# Patient Record
Sex: Male | Born: 2005
Health system: Southern US, Community
[De-identification: ages and names within clinical notes are randomized; demographics above are authoritative.]

## PROBLEM LIST (undated history)

## (undated) DIAGNOSIS — J45909 Unspecified asthma, uncomplicated: Secondary | ICD-10-CM

## (undated) DIAGNOSIS — R569 Unspecified convulsions: Secondary | ICD-10-CM

## (undated) DIAGNOSIS — F909 Attention-deficit hyperactivity disorder, unspecified type: Secondary | ICD-10-CM

## (undated) HISTORY — DX: Unspecified asthma, uncomplicated: J45.909

## (undated) HISTORY — DX: Attention-deficit hyperactivity disorder, unspecified type: F90.9

## (undated) HISTORY — DX: Unspecified convulsions: R56.9

---
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
## 2023-02-14 ENCOUNTER — Ambulatory Visit: Payer: 59 | Admitting: Dermatology

## 2023-02-14 VITALS — BP 106/69 | HR 60

## 2023-02-14 DIAGNOSIS — L814 Other melanin hyperpigmentation: Secondary | ICD-10-CM | POA: Diagnosis not present

## 2023-02-14 DIAGNOSIS — D2239 Melanocytic nevi of other parts of face: Secondary | ICD-10-CM

## 2023-02-14 DIAGNOSIS — D492 Neoplasm of unspecified behavior of bone, soft tissue, and skin: Secondary | ICD-10-CM

## 2023-02-14 NOTE — Progress Notes (Signed)
   New Patient Visit   Subjective  Mark Hardy is a 17 y.o. male who presents for the following: The patient has mole on his forehead to be evaluated, has grown and gets irritated, patient would like it removed today.   The following portions of the chart were reviewed this encounter and updated as appropriate: medications, allergies, medical history  Review of Systems:  No other skin or systemic complaints except as noted in HPI or Assessment and Plan.  Objective  Well appearing patient in no apparent distress; mood and affect are within normal limits.  A focused examination was performed of the following areas:face, scalp    Relevant exam findings are noted in the Assessment and Plan.  forehead at frontal hairline 6.0 mm flesh domed papule          Assessment & Plan    Neoplasm of skin forehead at frontal hairline  Epidermal / dermal shaving  Lesion diameter (cm):  0.6 Informed consent: discussed and consent obtained   Procedure prep:  Patient was prepped and draped in usual sterile fashion Prep type:  Isopropyl alcohol Anesthesia: the lesion was anesthetized in a standard fashion   Anesthetic:  1% lidocaine w/ epinephrine 1-100,000 buffered w/ 8.4% NaHCO3 Instrument used: flexible razor blade   Hemostasis achieved with: pressure, aluminum chloride and electrodesiccation   Outcome: patient tolerated procedure well   Post-procedure details: sterile dressing applied and wound care instructions given   Dressing type: bandage and petrolatum    Specimen 1 - Surgical pathology Differential Diagnosis: R/O Irritated Nevus vs other   Check Margins: No  Discussed resulting small scar with shave removal, and possible recurrence of lesion.  Recommend vaseline ointment to area daily and cover until healed.  Recommend photoprotection/sunscreen to area to prevent discoloration of scar.  Once healed, may apply OTC Serica scar gel bid to thickened scars.    LENTIGINES Exam: scattered tan macules Due to sun exposure Treatment Plan: Benign-appearing, observe. Recommend daily broad spectrum sunscreen SPF 30+ to sun-exposed areas, reapply every 2 hours as needed.  Call for any changes   Return if symptoms worsen or fail to improve.  I, Angelique Holm, CMA, am acting as scribe for Willeen Niece, MD .   Documentation: I have reviewed the above documentation for accuracy and completeness, and I agree with the above.  Willeen Niece, MD

## 2023-02-14 NOTE — Patient Instructions (Addendum)
Wound Care Instructions  Cleanse wound gently with soap and water once a day then pat dry with clean gauze. Apply a thin coat of Petrolatum (petroleum jelly, "Vaseline") over the wound (unless you have an allergy to this). We recommend that you use a new, sterile tube of Vaseline. Do not pick or remove scabs. Do not remove the yellow or white "healing tissue" from the base of the wound.  Cover the wound with fresh, clean, nonstick gauze and secure with paper tape. You may use Band-Aids in place of gauze and tape if the wound is small enough, but would recommend trimming much of the tape off as there is often too much. Sometimes Band-Aids can irritate the skin.  You should call the office for your biopsy report after 1 week if you have not already been contacted.  If you experience any problems, such as abnormal amounts of bleeding, swelling, significant bruising, significant pain, or evidence of infection, please call the office immediately.  FOR ADULT SURGERY PATIENTS: If you need something for pain relief you may take 1 extra strength Tylenol (acetaminophen) AND 2 Ibuprofen (200mg each) together every 4 hours as needed for pain. (do not take these if you are allergic to them or if you have a reason you should not take them.) Typically, you may only need pain medication for 1 to 3 days.     Due to recent changes in healthcare laws, you may see results of your pathology and/or laboratory studies on MyChart before the doctors have had a chance to review them. We understand that in some cases there may be results that are confusing or concerning to you. Please understand that not all results are received at the same time and often the doctors may need to interpret multiple results in order to provide you with the best plan of care or course of treatment. Therefore, we ask that you please give us 2 business days to thoroughly review all your results before contacting the office for clarification. Should  we see a critical lab result, you will be contacted sooner.   If You Need Anything After Your Visit  If you have any questions or concerns for your doctor, please call our main line at [phone] and press option 4 to reach your doctor's medical assistant. If no one answers, please leave a voicemail as directed and we will return your call as soon as possible. Messages left after 4 pm will be answered the following business day.   You may also send us a message via MyChart. We typically respond to MyChart messages within 1-2 business days.  For prescription refills, please ask your pharmacy to contact our office. Our fax number is [phone].  If you have an urgent issue when the clinic is closed that cannot wait until the next business day, you can page your doctor at the number below.    Please note that while we do our best to be available for urgent issues outside of office hours, we are not available 24/7.   If you have an urgent issue and are unable to reach us, you may choose to seek medical care at your doctor's office, retail clinic, urgent care center, or emergency room.  If you have a medical emergency, please immediately call 911 or go to the emergency department.  Pager Numbers  - Dr. Kowalski: [phone]  - Dr. Moye: [phone]  - Dr. Stewart: [phone]  In the event of inclement weather, please call our main line at   [phone] for an update on the status of any delays or closures.  Dermatology Medication Tips: Please keep the boxes that topical medications come in in order to help keep track of the instructions about where and how to use these. Pharmacies typically print the medication instructions only on the boxes and not directly on the medication tubes.   If your medication is too expensive, please contact our office at [phone] option 4 or send us a message through MyChart.   We are unable to tell what your co-pay for medications will be in  advance as this is different depending on your insurance coverage. However, we may be able to find a substitute medication at lower cost or fill out paperwork to get insurance to cover a needed medication.   If a prior authorization is required to get your medication covered by your insurance company, please allow us 1-2 business days to complete this process.  Drug prices often vary depending on where the prescription is filled and some pharmacies may offer cheaper prices.  The website www.goodrx.com contains coupons for medications through different pharmacies. The prices here do not account for what the cost may be with help from insurance (it may be cheaper with your insurance), but the website can give you the price if you did not use any insurance.  - You can print the associated coupon and take it with your prescription to the pharmacy.  - You may also stop by our office during regular business hours and pick up a GoodRx coupon card.  - If you need your prescription sent electronically to a different pharmacy, notify our office through [facility] MyChart or by phone at [phone] option 4.     Si Usted Necesita Algo Despus de Su Visita  Tambin puede enviarnos un mensaje a travs de MyChart. Por lo general respondemos a los mensajes de MyChart en el transcurso de 1 a 2 das hbiles.  Para renovar recetas, por favor pida a su farmacia que se ponga en contacto con nuestra oficina. Nuestro nmero de fax es el [phone].  Si tiene un asunto urgente cuando la clnica est cerrada y que no puede esperar hasta el siguiente da hbil, puede llamar/localizar a su doctor(a) al nmero que aparece a continuacin.   Por favor, tenga en cuenta que aunque hacemos todo lo posible para estar disponibles para asuntos urgentes fuera del horario de oficina, no estamos disponibles las 24 horas del da, los 7 das de la semana.   Si tiene un problema urgente y no puede comunicarse con nosotros, puede  optar por buscar atencin mdica  en el consultorio de su doctor(a), en una clnica privada, en un centro de atencin urgente o en una sala de emergencias.  Si tiene una emergencia mdica, por favor llame inmediatamente al 911 o vaya a la sala de emergencias.  Nmeros de bper  - Dr. Kowalski: [phone]  - Dra. Moye: [phone]  - Dra. Stewart: [phone]  En caso de inclemencias del tiempo, por favor llame a nuestra lnea principal al [phone] para una actualizacin sobre el estado de cualquier retraso o cierre.  Consejos para la medicacin en dermatologa: Por favor, guarde las cajas en las que vienen los medicamentos de uso tpico para ayudarle a seguir las instrucciones sobre dnde y cmo usarlos. Las farmacias generalmente imprimen las instrucciones del medicamento slo en las cajas y no directamente en los tubos del medicamento.   Si su medicamento es muy caro, por favor, pngase en contacto con   nuestra oficina llamando al [phone] y presione la opcin 4 o envenos un mensaje a travs de MyChart.   No podemos decirle cul ser su copago por los medicamentos por adelantado ya que esto es diferente dependiendo de la cobertura de su seguro. Sin embargo, es posible que podamos encontrar un medicamento sustituto a menor costo o llenar un formulario para que el seguro cubra el medicamento que se considera necesario.   Si se requiere una autorizacin previa para que su compaa de seguros cubra su medicamento, por favor permtanos de 1 a 2 das hbiles para completar este proceso.  Los precios de los medicamentos varan con frecuencia dependiendo del lugar de dnde se surte la receta y alguna farmacias pueden ofrecer precios ms baratos.  El sitio web www.goodrx.com tiene cupones para medicamentos de diferentes farmacias. Los precios aqu no tienen en cuenta lo que podra costar con la ayuda del seguro (puede ser ms barato con su seguro), pero el sitio web puede darle el  precio si no utiliz ningn seguro.  - Puede imprimir el cupn correspondiente y llevarlo con su receta a la farmacia.  - Tambin puede pasar por nuestra oficina durante el horario de atencin regular y recoger una tarjeta de cupones de GoodRx.  - Si necesita que su receta se enve electrnicamente a una farmacia diferente, informe a nuestra oficina a travs de MyChart de [facility] o por telfono llamando al [phone] y presione la opcin 4.  

## 2023-02-21 ENCOUNTER — Telehealth: Payer: Self-pay

## 2023-02-21 NOTE — Telephone Encounter (Signed)
-----   Message from Tara Stewart, MD sent at 02/21/2023  5:42 PM EDT ----- Skin , forehead at frontal hairline MELANOCYTIC NEVUS, INTRADERMAL TYPE, BASE INVOLVED  Benign mole, may recur - please call patient 

## 2023-02-21 NOTE — Telephone Encounter (Signed)
Left pt msg to call for bx result/sh °

## 2023-02-23 ENCOUNTER — Telehealth: Payer: Self-pay

## 2023-02-23 NOTE — Telephone Encounter (Signed)
-----   Message from Willeen Niece, MD sent at 02/21/2023  5:42 PM EDT ----- Skin , forehead at frontal hairline MELANOCYTIC NEVUS, INTRADERMAL TYPE, BASE INVOLVED  Benign mole, may recur - please call patient

## 2023-02-23 NOTE — Telephone Encounter (Signed)
Left message on cell phone, as authorized in DRP, biopsy of the forehead at frontal hairline was bening, may recur. Patient to call with any questions or concerns.

## 2023-06-22 ENCOUNTER — Other Ambulatory Visit: Payer: Self-pay

## 2023-07-20 DIAGNOSIS — Z1331 Encounter for screening for depression: Secondary | ICD-10-CM | POA: Diagnosis not present

## 2023-07-20 DIAGNOSIS — J4520 Mild intermittent asthma, uncomplicated: Secondary | ICD-10-CM | POA: Diagnosis not present

## 2023-07-20 DIAGNOSIS — F909 Attention-deficit hyperactivity disorder, unspecified type: Secondary | ICD-10-CM | POA: Diagnosis not present

## 2023-07-20 DIAGNOSIS — Z0000 Encounter for general adult medical examination without abnormal findings: Secondary | ICD-10-CM | POA: Diagnosis not present

## 2023-07-20 DIAGNOSIS — R569 Unspecified convulsions: Secondary | ICD-10-CM | POA: Diagnosis not present

## 2023-07-20 DIAGNOSIS — Z23 Encounter for immunization: Secondary | ICD-10-CM | POA: Diagnosis not present

## 2023-08-21 ENCOUNTER — Other Ambulatory Visit: Payer: Self-pay

## 2023-08-21 ENCOUNTER — Encounter: Payer: Self-pay | Admitting: Family Medicine

## 2023-08-21 ENCOUNTER — Ambulatory Visit (INDEPENDENT_AMBULATORY_CARE_PROVIDER_SITE_OTHER): Payer: 59 | Admitting: Family Medicine

## 2023-08-21 VITALS — BP 111/73 | HR 89 | Temp 98.2°F | Ht 69.75 in | Wt 186.0 lb

## 2023-08-21 DIAGNOSIS — F902 Attention-deficit hyperactivity disorder, combined type: Secondary | ICD-10-CM

## 2023-08-21 DIAGNOSIS — R569 Unspecified convulsions: Secondary | ICD-10-CM | POA: Diagnosis not present

## 2023-08-21 DIAGNOSIS — J4520 Mild intermittent asthma, uncomplicated: Secondary | ICD-10-CM

## 2023-08-21 DIAGNOSIS — J45909 Unspecified asthma, uncomplicated: Secondary | ICD-10-CM | POA: Insufficient documentation

## 2023-08-21 MED ORDER — AMPHETAMINE-DEXTROAMPHET ER 30 MG PO CP24
30.0000 mg | ORAL_CAPSULE | Freq: Every day | ORAL | 0 refills | Status: DC
  Filled 2023-08-21 (×2): qty 30, 30d supply, fill #0

## 2023-08-21 MED ORDER — AMPHETAMINE-DEXTROAMPHET ER 30 MG PO CP24
30.0000 mg | ORAL_CAPSULE | Freq: Every day | ORAL | 0 refills | Status: DC
  Filled 2023-08-21 – 2023-10-15 (×2): qty 30, 30d supply, fill #0

## 2023-08-21 MED ORDER — AMPHETAMINE-DEXTROAMPHET ER 30 MG PO CP24
30.0000 mg | ORAL_CAPSULE | Freq: Every day | ORAL | 0 refills | Status: DC
  Filled 2023-08-21: qty 30, 30d supply, fill #0

## 2023-08-21 NOTE — Assessment & Plan Note (Signed)
ADHD, seeking to restart medication regimen after a change in [employer]. Previously on Adderall XR 30 mg daily and clonidine 0.1 mg at night. No longer needs clonidine due to a normal sleep schedule. Decision to restart Adderall to improve focus. Prefers to start with the previous dose and monitor for efficacy and tolerability. - Prescribe Adderall XR 30 mg daily - Send prescription x3 to [facility] - Schedule follow-up in three months to assess efficacy and tolerability

## 2023-08-21 NOTE — Progress Notes (Signed)
New Patient Office Visit  Subjective    Patient ID: Mark Hardy, male    DOB: [birth year]  Age: 17 y.o. MRN: [id number]  CC:  Chief Complaint  Patient presents with   Establish Care    HPI Mark Hardy presents to establish care   Discussed the use of AI scribe software for clinical note transcription with the patient, who gave verbal consent to proceed.  History of Present Illness   Mark Hardy, a young adult with a past medical history of ADHD, seizures related to sleep deprivation, and childhood asthma, presents to the clinic requesting to restart his ADHD medication. He had previously stopped the medication due to plans to join the [employer], but those plans have since changed. He does not recall the specific medication he was taking for ADHD, but remembers it was two small white pills and a salmon-colored capsule. He denies current sleep issues and does not feel the need to restart the sleep aid he was previously taking along with his ADHD medication. Mark Hardy also has a history of a single seizure related to sleep deprivation, but he is not currently on any medication for this. He also has a history of childhood asthma, for which he keeps an albuterol inhaler for emergencies, but he cannot recall the last time he needed to use it. Mark Hardy had an appendectomy in 2014. He is currently working two jobs and reports a normal sleep schedule.       Outpatient Encounter Medications as of 08/21/2023  Medication Sig   albuterol (VENTOLIN HFA) 108 (90 Base) MCG/ACT inhaler Inhale 2 puffs into the lungs every 4 (four) hours as needed for wheezing   amphetamine-dextroamphetamine (ADDERALL XR) 30 MG 24 hr capsule Take 1 capsule (30 mg total) by mouth daily.   amphetamine-dextroamphetamine (ADDERALL XR) 30 MG 24 hr capsule Take 1 capsule (30 mg total) by mouth daily.   amphetamine-dextroamphetamine (ADDERALL XR) 30 MG 24 hr capsule Take 1 capsule (30 mg total) by mouth daily.   [DISCONTINUED]  amoxicillin (AMOXIL) 875 MG tablet Take one tablet by mouth three times daily today, then take 1 tablet twice a day after today until gone   [DISCONTINUED] amphetamine-dextroamphetamine (ADDERALL XR) 30 MG 24 hr capsule TAKE 1 CAPSULE BY MOUTH TWICE A DAY; MORNING AND LUNCH   [DISCONTINUED] amphetamine-dextroamphetamine (ADDERALL XR) 30 MG 24 hr capsule TAKE 1 CAPSULE BY MOUTH TWICE DAILY (MORNING AND LUNCH)   [DISCONTINUED] amphetamine-dextroamphetamine (ADDERALL XR) 30 MG 24 hr capsule TAKE ONE CAPSULE BY MOUTH TWICE DAILY IN THE MORNING AND AT LUNCH   [DISCONTINUED] amphetamine-dextroamphetamine (ADDERALL XR) 30 MG 24 hr capsule TAKE 1 CAPSULE BY MOUTH TWICE A DAY IN THE MORNING AND AT LUNCH   [DISCONTINUED] amphetamine-dextroamphetamine (ADDERALL XR) 30 MG 24 hr capsule Take 1 capsule by mouth twice a day; morning and lunch   [DISCONTINUED] amphetamine-dextroamphetamine (ADDERALL XR) 30 MG 24 hr capsule 1 capsule by mouth in the morning; fill on or after 03/05/2022   [DISCONTINUED] amphetamine-dextroamphetamine (ADDERALL XR) 30 MG 24 hr capsule 1 capsule by mouth in the morning; fill on or after 04/08/2022   [DISCONTINUED] amphetamine-dextroamphetamine (ADDERALL XR) 30 MG 24 hr capsule 1 capsule by mouth in the morning; fill on or after 05/08/2022   [DISCONTINUED] amphetamine-dextroamphetamine (ADDERALL) 30 MG tablet Take 30 mg by mouth 2 (two) times daily.   [DISCONTINUED] cloNIDine HCl (KAPVAY) 0.1 MG TB12 ER tablet Take by mouth.   [DISCONTINUED] cloNIDine HCl (KAPVAY) 0.1 MG TB12 ER tablet TAKE 2 TABLETS  BY MOUTH TWICE A DAY   [DISCONTINUED] cloNIDine HCl (KAPVAY) 0.1 MG TB12 ER tablet TAKE 2 TABLETS BY MOUTH TWICE A DAY   [DISCONTINUED] cloNIDine HCl (KAPVAY) 0.1 MG TB12 ER tablet take 2 tablets by mouth twice a day   [DISCONTINUED] cloNIDine HCl (KAPVAY) 0.1 MG TB12 ER tablet 2 tablet by mouth nightly   [DISCONTINUED] COVID-19 At Home Antigen Test (CARESTART COVID-19 HOME TEST) KIT use as  directed   [DISCONTINUED] diazepam (DIASTAT ACUDIAL) 10 MG GEL PLACE 10 MG RECTALLY ONCE FOR 1 DOSE.   [DISCONTINUED] guanFACINE (INTUNIV) 4 MG TB24 ER tablet TAKE ONE TABLET BY MOUTH NIGHTLY   [DISCONTINUED] oseltamivir (TAMIFLU) 75 MG capsule Take 1 capsule (75 mg total) by mouth 2 (two) times daily for 5 days   [DISCONTINUED] oxyCODONE (OXY IR/ROXICODONE) 5 MG immediate release tablet Take one tablet by mouth every 6 hours as needed for dental pain only after the ibuprofen/acetaminophen has not been effective   No facility-administered encounter medications on file as of 08/21/2023.    Past Medical History:  Diagnosis Date   ADHD    Asthma    Seizures (HCC)     Past Surgical History:  Procedure Laterality Date   APPENDECTOMY  2014    Family History  Problem Relation Age of Onset   ADD / ADHD Mother    ADD / ADHD Father    ADD / ADHD Sister    Depression Maternal Grandmother    Alcohol abuse Maternal Grandfather    Depression Maternal Grandfather    Heart attack Maternal Grandfather    Heart disease Maternal Grandfather    Hyperlipidemia Maternal Grandfather    Hypertension Maternal Grandfather    Alcohol abuse Paternal Grandfather    Depression Paternal Grandfather    Colon cancer Neg Hx    Breast cancer Neg Hx     Social History   Socioeconomic History   Marital status: Single    Spouse name: Not on file   Number of children: 0   Years of education: Not on file   Highest education level: Not on file  Occupational History   Not on file  Tobacco Use   Smoking status: Never   Smokeless tobacco: Never  Vaping Use   Vaping status: Never Used  Substance and Sexual Activity   Alcohol use: Never   Drug use: Never   Sexual activity: Never  Other Topics Concern   Not on file  Social History Narrative   Not on file   Social Determinants of Health   Financial Resource Strain: Not on file  Food Insecurity: Not on file  Transportation Needs: Not on file   Physical Activity: Not on file  Stress: Not on file  Social Connections: Not on file  Intimate Partner Violence: Not on file    ROS      Objective    BP 111/73 (BP Location: Left Arm, Patient Position: Sitting, Cuff Size: Normal)   Pulse 89   Temp 98.2 F (36.8 C) (Oral)   Ht 5' 9.75" (1.772 m)   Wt 186 lb (84.4 kg)   SpO2 97%   BMI 26.88 kg/m   Physical Exam Vitals reviewed.  Constitutional:      General: He is not in acute distress.    Appearance: Normal appearance. He is well-developed. He is not diaphoretic.  HENT:     Head: Normocephalic and atraumatic.     Right Ear: Tympanic membrane, ear canal and external ear normal.  Left Ear: Tympanic membrane, ear canal and external ear normal.     Nose: Nose normal.     Mouth/Throat:     Mouth: Mucous membranes are moist.     Pharynx: Oropharynx is clear. No oropharyngeal exudate.  Eyes:     General: No scleral icterus.    Conjunctiva/sclera: Conjunctivae normal.     Pupils: Pupils are equal, round, and reactive to light.  Neck:     Thyroid: No thyromegaly.  Cardiovascular:     Rate and Rhythm: Normal rate and regular rhythm.     Heart sounds: Normal heart sounds. No murmur heard. Pulmonary:     Effort: Pulmonary effort is normal. No respiratory distress.     Breath sounds: Normal breath sounds. No wheezing or rales.  Abdominal:     General: There is no distension.     Palpations: Abdomen is soft.     Tenderness: There is no abdominal tenderness.  Musculoskeletal:        General: No deformity.     Cervical back: Neck supple.     Right lower leg: No edema.     Left lower leg: No edema.  Lymphadenopathy:     Cervical: No cervical adenopathy.  Skin:    General: Skin is warm and dry.     Findings: No rash.  Neurological:     Mental Status: He is alert and oriented to person, place, and time. Mental status is at baseline.     Gait: Gait normal.  Psychiatric:        Mood and Affect: Mood normal.         Behavior: Behavior normal.        Thought Content: Thought content normal.         Assessment & Plan:   Problem List Items Addressed This Visit       Respiratory   Asthma    Childhood asthma with no recent symptoms or need for medication. Has an albuterol inhaler for emergencies but has not used it recently. - No current treatment needed - Advise to keep albuterol inhaler available for emergencies        Other   Attention deficit hyperactivity disorder (ADHD), combined type - Primary    ADHD, seeking to restart medication regimen after a change in [employer]. Previously on Adderall XR 30 mg daily and clonidine 0.1 mg at night. No longer needs clonidine due to a normal sleep schedule. Decision to restart Adderall to improve focus. Prefers to start with the previous dose and monitor for efficacy and tolerability. - Prescribe Adderall XR 30 mg daily - Send prescription x3 to [facility] - Schedule follow-up in three months to assess efficacy and tolerability      Single epileptic seizure (HCC)    Single one-time Seizure attributed to sleep deprivation. Not currently on medication and lacks emergency medication. Discussed importance of sleep hygiene to prevent seizures. - Monitor for recurrence of seizures - Educate on sleep hygiene           General Health Maintenance Denies alcohol, smoking, drug use, and sexual activity. Refuses vaping, e-cigarettes, and chewing tobacco. Recently had a physical examination at [facility]. - No additional health maintenance actions required  Follow-up - Schedule follow-up appointment in three months - Advise to contact the office if any issues arise with the medication.       Meds ordered this encounter  Medications   amphetamine-dextroamphetamine (ADDERALL XR) 30 MG 24 hr capsule    Sig:  Take 1 capsule (30 mg total) by mouth daily.    Dispense:  30 capsule    Refill:  0    Do not fill <30 days from last refill    amphetamine-dextroamphetamine (ADDERALL XR) 30 MG 24 hr capsule    Sig: Take 1 capsule (30 mg total) by mouth daily.    Dispense:  30 capsule    Refill:  0    Do not fill <30 days from last refill   amphetamine-dextroamphetamine (ADDERALL XR) 30 MG 24 hr capsule    Sig: Take 1 capsule (30 mg total) by mouth daily.    Dispense:  30 capsule    Refill:  0    Do not fill <30 days from last refill      Return in about 3 months (around 11/21/2023) for ADD f/u, virtual ok.   Shirlee Latch, MD

## 2023-08-21 NOTE — Assessment & Plan Note (Signed)
Childhood asthma with no recent symptoms or need for medication. Has an albuterol inhaler for emergencies but has not used it recently. - No current treatment needed - Advise to keep albuterol inhaler available for emergencies

## 2023-08-21 NOTE — Assessment & Plan Note (Signed)
Single one-time Seizure attributed to sleep deprivation. Not currently on medication and lacks emergency medication. Discussed importance of sleep hygiene to prevent seizures. - Monitor for recurrence of seizures - Educate on sleep hygiene

## 2023-08-22 ENCOUNTER — Encounter: Payer: Self-pay | Admitting: Family Medicine

## 2023-10-15 ENCOUNTER — Other Ambulatory Visit: Payer: Self-pay

## 2023-11-21 ENCOUNTER — Ambulatory Visit: Payer: Self-pay | Admitting: Family Medicine

## 2023-11-21 NOTE — Progress Notes (Deleted)
      Established patient visit   Patient: Mark Hardy   DOB: 2006-04-24   18 y.o. Male  MRN: [id number] Visit Date: 11/21/2023  Today's healthcare provider: Jon Eva, MD   No chief complaint on file.  Subjective    HPI   Discussed the use of AI scribe software for clinical note transcription with the patient, who gave verbal consent to proceed.  History of Present Illness             Medications: Outpatient Medications Prior to Visit  Medication Sig   albuterol  (VENTOLIN  HFA) 108 (90 Base) MCG/ACT inhaler Inhale 2 puffs into the lungs every 4 (four) hours as needed for wheezing   amphetamine -dextroamphetamine  (ADDERALL  XR) 30 MG 24 hr capsule Take 1 capsule (30 mg total) by mouth daily.   amphetamine -dextroamphetamine  (ADDERALL  XR) 30 MG 24 hr capsule Take 1 capsule (30 mg total) by mouth daily.   amphetamine -dextroamphetamine  (ADDERALL  XR) 30 MG 24 hr capsule Take 1 capsule (30 mg total) by mouth daily.   No facility-administered medications prior to visit.    Review of Systems {Insert previous labs (optional):23779} {See past labs  Heme  Chem  Endocrine  Serology  Results Review (optional):1}   Objective    There were no vitals taken for this visit. {Insert last BP/Wt (optional):23777}{See vitals history (optional):1}  Physical Exam   No results found for any visits on 11/21/23.  Assessment & Plan     Problem List Items Addressed This Visit   None   Assessment and Plan               No follow-ups on file.       Jon Eva, MD  [facility] [phone] (phone) [phone] (fax)  [facility]

## 2023-11-24 ENCOUNTER — Encounter: Payer: Self-pay | Admitting: Family Medicine

## 2023-11-24 ENCOUNTER — Ambulatory Visit: Payer: Self-pay | Admitting: Family Medicine

## 2023-11-24 ENCOUNTER — Other Ambulatory Visit: Payer: Self-pay

## 2023-11-24 VITALS — BP 128/74 | HR 96 | Ht 69.0 in | Wt 186.8 lb

## 2023-11-24 DIAGNOSIS — F902 Attention-deficit hyperactivity disorder, combined type: Secondary | ICD-10-CM | POA: Diagnosis not present

## 2023-11-24 MED ORDER — AMPHETAMINE-DEXTROAMPHET ER 30 MG PO CP24
30.0000 mg | ORAL_CAPSULE | Freq: Every day | ORAL | 0 refills | Status: DC
  Filled 2023-11-24 – 2023-12-25 (×2): qty 30, 30d supply, fill #0

## 2023-11-24 MED ORDER — AMPHETAMINE-DEXTROAMPHET ER 30 MG PO CP24
30.0000 mg | ORAL_CAPSULE | Freq: Every day | ORAL | 0 refills | Status: DC
  Filled 2023-11-24: qty 30, 30d supply, fill #0

## 2023-11-24 MED ORDER — AMPHETAMINE-DEXTROAMPHET ER 30 MG PO CP24
30.0000 mg | ORAL_CAPSULE | Freq: Every day | ORAL | 0 refills | Status: DC
  Filled 2023-11-24: qty 30, 30d supply, fill #0
  Filled 2024-02-16: qty 24, 24d supply, fill #0
  Filled 2024-02-16: qty 6, 6d supply, fill #0

## 2023-11-24 NOTE — Assessment & Plan Note (Signed)
 ADHD managed with Adderall  30 mg extended release in the morning. Initial side effects of feeling revved up and jittery have resolved. Medication is effective but wears off by 2 AM, problematic for late shifts. Patient prefers to continue current regimen despite this issue. - Continue Adderall  30 mg extended release in the morning - Send three months' refills to hospital pharmacy - Schedule follow-up in four months

## 2023-11-24 NOTE — Progress Notes (Signed)
 Established patient visit   Patient: Mark Hardy   DOB: 02/02/2006   17 y.o. Male  MRN: [id number] Visit Date: 11/24/2023  Today's healthcare provider: Jon Eva, MD   Chief Complaint  Patient presents with   Sore    Patient reports spot on finger X 2-3 days. Reports it showed up during work. Mother removed whatever was in it on Wednesday with no fluids. Reports irritating but no pain.    Immunizations    Pneumonia and HPV declined   ADHD    08/21/23 last seen   Subjective    HPI HPI     Sore    Additional comments: Patient reports spot on finger X 2-3 days. Reports it showed up during work. Mother removed whatever was in it on Wednesday with no fluids. Reports irritating but no pain.         Immunizations    Additional comments: Pneumonia and HPV declined        ADHD    Additional comments: 08/21/23 last seen      Last edited by Lilian Fitzpatrick, CMA on 11/24/2023 11:10 AM.       Discussed the use of AI scribe software for clinical note transcription with the patient, who gave verbal consent to proceed.  History of Present Illness   The patient, with a history of ADHD, presents for a medication follow-up. He reports that the Adderall  30mg  extended release, taken in the morning, has been working well. Initially, he experienced a period of feeling 'revved up and jittery,' but this has since settled. The medication lasts through the day, with the only issue being that it wears off around 2 AM when he is working late. However, he notes that he is not often working that late, with the latest shift ending at 12 AM.  In addition, the patient reports a recent minor injury to his finger, which was a sliver that has since been removed. He has no other concerns or issues at this time.       Medications: Outpatient Medications Prior to Visit  Medication Sig   albuterol  (VENTOLIN  HFA) 108 (90 Base) MCG/ACT inhaler Inhale 2 puffs into the lungs every 4 (four)  hours as needed for wheezing   [DISCONTINUED] amphetamine -dextroamphetamine  (ADDERALL  XR) 30 MG 24 hr capsule Take 1 capsule (30 mg total) by mouth daily.   [DISCONTINUED] amphetamine -dextroamphetamine  (ADDERALL  XR) 30 MG 24 hr capsule Take 1 capsule (30 mg total) by mouth daily.   [DISCONTINUED] amphetamine -dextroamphetamine  (ADDERALL  XR) 30 MG 24 hr capsule Take 1 capsule (30 mg total) by mouth daily.   No facility-administered medications prior to visit.    Review of Systems     Objective    BP 128/74 (Cuff Size: Normal)   Pulse 96   Ht 5' 9 (1.753 m)   Wt 186 lb 12.8 oz (84.7 kg)   SpO2 97%   BMI 27.59 kg/m    Physical Exam Vitals reviewed.  Constitutional:      General: He is not in acute distress.    Appearance: Normal appearance. He is not diaphoretic.  HENT:     Head: Normocephalic and atraumatic.  Eyes:     General: No scleral icterus.    Conjunctiva/sclera: Conjunctivae normal.  Cardiovascular:     Rate and Rhythm: Normal rate and regular rhythm.     Heart sounds: Normal heart sounds. No murmur heard. Pulmonary:     Effort: Pulmonary effort is normal. No respiratory distress.  Breath sounds: Normal breath sounds. No wheezing or rhonchi.  Musculoskeletal:     Cervical back: Neck supple.     Right lower leg: No edema.     Left lower leg: No edema.  Lymphadenopathy:     Cervical: No cervical adenopathy.  Skin:    General: Skin is warm and dry.     Findings: No rash.  Neurological:     Mental Status: He is alert and oriented to person, place, and time. Mental status is at baseline.  Psychiatric:        Mood and Affect: Mood normal.        Behavior: Behavior normal.      No results found for any visits on 11/24/23.  Assessment & Plan     Problem List Items Addressed This Visit       Other   Attention deficit hyperactivity disorder (ADHD), combined type - Primary   ADHD managed with Adderall  30 mg extended release in the morning. Initial side  effects of feeling revved up and jittery have resolved. Medication is effective but wears off by 2 AM, problematic for late shifts. Patient prefers to continue current regimen despite this issue. - Continue Adderall  30 mg extended release in the morning - Send three months' refills to hospital pharmacy - Schedule follow-up in four months          Foreign Body in Finger Foreign body in the finger, likely a sliver, removed by his mother. Examination reveals a scab with no remaining foreign material. - Monitor for signs of infection or persistent foreign body sensation  General Health Maintenance Physical examination in October at [facility]. Next physical due in eight months. - Schedule next physical examination in eight months  Follow-up - Schedule ADHD follow-up in four months, in person or virtual.        Return in about 4 months (around 03/23/2024) for ADD f/u.       Jon Eva, MD  [facility] [phone] (phone) [phone] (fax)  [facility]

## 2023-12-05 ENCOUNTER — Other Ambulatory Visit: Payer: Self-pay

## 2023-12-25 ENCOUNTER — Other Ambulatory Visit: Payer: Self-pay

## 2024-02-16 ENCOUNTER — Other Ambulatory Visit: Payer: Self-pay

## 2024-03-12 ENCOUNTER — Other Ambulatory Visit: Payer: Self-pay

## 2024-03-12 ENCOUNTER — Ambulatory Visit: Admitting: Family Medicine

## 2024-03-12 VITALS — BP 113/79 | HR 91 | Ht 69.0 in | Wt 184.6 lb

## 2024-03-12 DIAGNOSIS — M25532 Pain in left wrist: Secondary | ICD-10-CM | POA: Diagnosis not present

## 2024-03-12 DIAGNOSIS — M779 Enthesopathy, unspecified: Secondary | ICD-10-CM

## 2024-03-12 DIAGNOSIS — F902 Attention-deficit hyperactivity disorder, combined type: Secondary | ICD-10-CM

## 2024-03-12 MED ORDER — AMPHETAMINE-DEXTROAMPHETAMINE 30 MG PO TABS
30.0000 mg | ORAL_TABLET | Freq: Every day | ORAL | 0 refills | Status: DC
Start: 2024-03-12 — End: 2024-06-13
  Filled 2024-03-12: qty 30, 30d supply, fill #0

## 2024-03-12 MED ORDER — AMPHETAMINE-DEXTROAMPHET ER 30 MG PO CP24
30.0000 mg | ORAL_CAPSULE | Freq: Every day | ORAL | 0 refills | Status: DC
  Filled 2024-03-12 – 2024-04-24 (×2): qty 30, 30d supply, fill #0

## 2024-03-12 MED ORDER — AMPHETAMINE-DEXTROAMPHET ER 30 MG PO CP24
30.0000 mg | ORAL_CAPSULE | Freq: Every day | ORAL | 0 refills | Status: DC
Start: 2024-03-12 — End: 2024-06-13
  Filled 2024-03-12: qty 30, 30d supply, fill #0

## 2024-03-12 MED ORDER — AMPHETAMINE-DEXTROAMPHET ER 30 MG PO CP24
30.0000 mg | ORAL_CAPSULE | Freq: Every day | ORAL | 0 refills | Status: DC
  Filled 2024-03-12: qty 30, 30d supply, fill #0

## 2024-03-12 NOTE — Assessment & Plan Note (Signed)
 ADHD managed with 30 mg extended-release Adderall  in the morning. Experiences decreased focus and increased distractibility in the afternoon as the medication wears off, impacting work performance during late shifts. Discussed adding a short-acting Adderall  dose in the afternoon to address this issue. The short-acting dose will be used as needed, particularly on workdays requiring extended focus. - Continue 30 mg extended-release Adderall  in the morning. - Prescribe a short-acting Adderall  dose for the afternoon to be taken as needed, particularly on workdays requiring extended focus. - Provide a one-month supply of the short-acting dose and assess its effectiveness before refilling. - Instruct to communicate any issues with the new medication regimen via Mychar.

## 2024-03-12 NOTE — Progress Notes (Signed)
 Established patient visit   Patient: Mark Hardy   DOB: [birth year]   17 y.o. Male  MRN: [id number] Visit Date: 03/12/2024  Today's healthcare provider: Aden Agreste, MD   Chief Complaint  Patient presents with   Medication Refill   ADHD   Wrist Pain    Left wrist pain X few weeks. Reports intermittent pain mainly when twisting and does not take any form of treatment   Subjective    HPI HPI     Wrist Pain    Additional comments: Left wrist pain X few weeks. Reports intermittent pain mainly when twisting and does not take any form of treatment      Last edited by Pasty Bongo, CMA on 03/12/2024  1:01 PM.       Discussed the use of AI scribe software for clinical note transcription with the patient, who gave verbal consent to proceed.  History of Present Illness   Mark Hardy is a 18 year old male who presents with left wrist pain and concerns about Adderall  effectiveness.  He experiences joint pain in the left wrist for the past couple of weeks, particularly during specific wrist rotations, with a sensation of 'clicking'. The pain is localized to the left wrist, with no issues in the right wrist. 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 He has not used any treatments such as ice or medication. No numbness or significant pain during specific wrist movements, except for slight discomfort during resistant supination.  He takes Adderall  30 mg extended-release in the morning. The medication is effective, but he experiences a significant 'wean off' effect in the afternoons during his work shift from 2 PM to 11 PM, leading to increased distractibility. He seeks to address this issue to maintain focus throughout his workday.         Medications: Outpatient Medications Prior to Visit  Medication Sig   albuterol  (VENTOLIN  HFA) 108 (90 Base) MCG/ACT inhaler Inhale 2 puffs into the lungs every 4 (four) hours as needed for wheezing    [DISCONTINUED] amphetamine -dextroamphetamine  (ADDERALL  XR) 30 MG 24 hr capsule Take 1 capsule (30 mg total) by mouth daily.   [DISCONTINUED] amphetamine -dextroamphetamine  (ADDERALL  XR) 30 MG 24 hr capsule Take 1 capsule (30 mg total) by mouth daily.   [DISCONTINUED] amphetamine -dextroamphetamine  (ADDERALL  XR) 30 MG 24 hr capsule Take 1 capsule (30 mg total) by mouth daily.   No facility-administered medications prior to visit.    Review of Systems     Objective    BP 113/79 (BP Location: Left Arm, Patient Position: Sitting, Cuff Size: Normal)   Pulse 91   Ht 5\' 9"  (1.753 m)   Wt 184 lb 9.6 oz (83.7 kg)   SpO2 100%   BMI 27.26 kg/m    Physical Exam Vitals reviewed.  Constitutional:      General: He is not in acute distress.    Appearance: Normal appearance. He is not diaphoretic.  HENT:     Head: Normocephalic and atraumatic.  Eyes:     General: No scleral icterus.    Conjunctiva/sclera: Conjunctivae normal.  Cardiovascular:     Rate and Rhythm: Normal rate and regular rhythm.     Heart sounds: Normal heart sounds. No murmur heard. Pulmonary:     Effort: Pulmonary effort is normal. No respiratory distress.     Breath sounds: Normal breath sounds. No wheezing or rhonchi.  Musculoskeletal:     Cervical back: Neck supple.  Right lower leg: No edema.     Left lower leg: No edema.  Lymphadenopathy:     Cervical: No cervical adenopathy.  Skin:    General: Skin is warm and dry.     Findings: No rash.  Neurological:     Mental Status: He is alert and oriented to person, place, and time. Mental status is at baseline.  Psychiatric:        Mood and Affect: Mood normal.        Behavior: Behavior normal.      Physical Exam   MUSCULOSKELETAL: Normal grip strength. Negative Tinel's sign and Phalen's test on the left wrist. Pain on resisted supination of the left wrist. No TTP.       No results found for any visits on 03/12/24.  Assessment & Plan     Problem List Items  Addressed This Visit       Other   Attention deficit hyperactivity disorder (ADHD), combined type - Primary   ADHD managed with 30 mg extended-release Adderall  in the morning. Experiences decreased focus and increased distractibility in the afternoon as the medication wears off, impacting work performance during late shifts. Discussed adding a short-acting Adderall  dose in the afternoon to address this issue. The short-acting dose will be used as needed, particularly on workdays requiring extended focus. - Continue 30 mg extended-release Adderall  in the morning. - Prescribe a short-acting Adderall  dose for the afternoon to be taken as needed, particularly on workdays requiring extended focus. - Provide a one-month supply of the short-acting dose and assess its effectiveness before refilling. - Instruct to communicate any issues with the new medication regimen via Mychar.       Other Visit Diagnoses       Left wrist pain         Tendinitis               Left wrist tendonitis Left wrist tendonitis likely due to repetitive motions at work, specifically stocking and lifting activities at [employer]. Pain is exacerbated by specific wrist movements, particularly supination. The condition has been present for a couple of weeks. - Advise wearing a wrist brace at work to provide support during stocking and lifting activities. - Recommend using Voltaren cream, available over the counter, to be applied multiple times a day to the affected area. - Instruct to ice the wrist after work shifts to reduce inflammation. - Send wrist exercises via Mychar for additional support. - Advise to report back if symptoms do not improve.       Return in about 3 months (around 06/12/2024) for ADD f/u, virtual ok.       Aden Agreste, MD  [facility] [phone] (phone) [phone] (fax)  [facility]

## 2024-03-25 ENCOUNTER — Ambulatory Visit: Payer: Self-pay | Admitting: Family Medicine

## 2024-04-24 ENCOUNTER — Other Ambulatory Visit: Payer: Self-pay

## 2024-06-13 ENCOUNTER — Encounter: Payer: Self-pay | Admitting: Family Medicine

## 2024-06-13 ENCOUNTER — Other Ambulatory Visit: Payer: Self-pay

## 2024-06-13 ENCOUNTER — Ambulatory Visit (INDEPENDENT_AMBULATORY_CARE_PROVIDER_SITE_OTHER): Admitting: Family Medicine

## 2024-06-13 VITALS — BP 122/76 | HR 65 | Ht 69.02 in | Wt 180.0 lb

## 2024-06-13 DIAGNOSIS — F902 Attention-deficit hyperactivity disorder, combined type: Secondary | ICD-10-CM

## 2024-06-13 DIAGNOSIS — J4520 Mild intermittent asthma, uncomplicated: Secondary | ICD-10-CM | POA: Diagnosis not present

## 2024-06-13 MED ORDER — ALBUTEROL SULFATE HFA 108 (90 BASE) MCG/ACT IN AERS
2.0000 | INHALATION_SPRAY | RESPIRATORY_TRACT | 3 refills | Status: AC | PRN
  Filled 2024-06-13: qty 18, 30d supply, fill #0

## 2024-06-13 MED ORDER — AMPHETAMINE-DEXTROAMPHET ER 30 MG PO CP24
30.0000 mg | ORAL_CAPSULE | Freq: Every day | ORAL | 0 refills | Status: DC
  Filled 2024-06-13: qty 30, 30d supply, fill #0

## 2024-06-13 NOTE — Assessment & Plan Note (Signed)
 ADHD is well-managed with extended-release Adderall  30 mg in the morning. He reports effectiveness and satisfaction with the regimen, with no issues in focus or side effects. - Continue extended-release Adderall  30 mg in the morning. - Provide refills for the extended-release Adderall . - Reassess ADHD management during the next physical in approximately three months.

## 2024-06-13 NOTE — Assessment & Plan Note (Signed)
 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 - Send prescription for inhaler to the hospital pharmacy. - Ensure he has the inhaler on hand.

## 2024-06-13 NOTE — Progress Notes (Signed)
 Established patient visit   Patient: Mark Hardy   DOB: 06-27-2006   17 y.o. Male  MRN: [id number] Visit Date: 06/13/2024  Today's healthcare provider: Jon Eva, MD   Chief Complaint  Patient presents with   Medical Management of Chronic Issues   ADHD    Taking medication as prescribed and tolerating well. Reports he is needing refills today    Subjective    HPI HPI     ADHD    Additional comments: Taking medication as prescribed and tolerating well. Reports he is needing refills today       Last edited by Lilian Fitzpatrick, CMA on 06/13/2024 10:55 AM.       Discussed the use of AI scribe software for clinical note transcription with the patient, who gave verbal consent to proceed.  History of Present Illness   Mark Hardy is a 18 year old male with ADHD who presents for a medication check.  He is currently taking Adderall  extended release 30 mg in the mornings for ADHD. His mother has requested a refill for his inhaler as it is 'getting that time of year.' He usually picks up his medications from the hospital pharmacy.         Medications: Outpatient Medications Prior to Visit  Medication Sig   [DISCONTINUED] albuterol  (VENTOLIN  HFA) 108 (90 Base) MCG/ACT inhaler Inhale 2 puffs into the lungs every 4 (four) hours as needed for wheezing   [DISCONTINUED] amphetamine -dextroamphetamine  (ADDERALL  XR) 30 MG 24 hr capsule Take 1 capsule (30 mg total) by mouth daily.   [DISCONTINUED] amphetamine -dextroamphetamine  (ADDERALL  XR) 30 MG 24 hr capsule Take 1 capsule (30 mg total) by mouth daily.   [DISCONTINUED] amphetamine -dextroamphetamine  (ADDERALL  XR) 30 MG 24 hr capsule Take 1 capsule (30 mg total) by mouth daily.   [DISCONTINUED] amphetamine -dextroamphetamine  (ADDERALL ) 30 MG tablet Take 1 tablet by mouth daily. In the afternoon   No facility-administered medications prior to visit.    Review of Systems     Objective    BP 122/76 (BP Location:  Right Arm, Patient Position: Sitting, Cuff Size: Normal)   Pulse 65   Ht 5' 9.02 (1.753 m)   Wt 180 lb (81.6 kg)   SpO2 100%   BMI 26.57 kg/m    Physical Exam Vitals reviewed.  Constitutional:      General: He is not in acute distress.    Appearance: Normal appearance. He is not diaphoretic.  HENT:     Head: Normocephalic and atraumatic.  Eyes:     General: No scleral icterus.    Conjunctiva/sclera: Conjunctivae normal.  Cardiovascular:     Rate and Rhythm: Normal rate and regular rhythm.     Heart sounds: Normal heart sounds. No murmur heard. Pulmonary:     Effort: Pulmonary effort is normal. No respiratory distress.     Breath sounds: Normal breath sounds. No wheezing or rhonchi.  Musculoskeletal:     Cervical back: Neck supple.     Right lower leg: No edema.     Left lower leg: No edema.  Lymphadenopathy:     Cervical: No cervical adenopathy.  Skin:    General: Skin is warm and dry.     Findings: No rash.  Neurological:     Mental Status: He is alert and oriented to person, place, and time. Mental status is at baseline.  Psychiatric:        Mood and Affect: Mood normal.        Behavior:  Behavior normal.      No results found for any visits on 06/13/24.  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 - Send prescription for inhaler to the hospital pharmacy. - Ensure he has the inhaler on hand.       Relevant Medications   albuterol  (VENTOLIN  HFA) 108 (90 Base) MCG/ACT inhaler     Other   Attention deficit hyperactivity disorder (ADHD), combined type - Primary   ADHD is well-managed with extended-release Adderall  30 mg in the morning. He reports effectiveness and satisfaction with the regimen, with no issues in focus or side effects. - Continue extended-release Adderall  30 mg in the  morning. - Provide refills for the extended-release Adderall . - Reassess ADHD management during the next physical in approximately three months.        Return in about 3 months (around 09/13/2024) for CPE.       Jon Eva, MD  [facility] [phone] (phone) [phone] (fax)  [facility]

## 2024-09-16 ENCOUNTER — Ambulatory Visit: Admitting: Family Medicine

## 2024-09-16 ENCOUNTER — Other Ambulatory Visit: Payer: Self-pay

## 2024-09-16 ENCOUNTER — Encounter: Payer: Self-pay | Admitting: Family Medicine

## 2024-09-16 VITALS — BP 114/73 | HR 84 | Ht 70.5 in | Wt 186.1 lb

## 2024-09-16 DIAGNOSIS — F902 Attention-deficit hyperactivity disorder, combined type: Secondary | ICD-10-CM | POA: Diagnosis not present

## 2024-09-16 DIAGNOSIS — Z0000 Encounter for general adult medical examination without abnormal findings: Secondary | ICD-10-CM | POA: Diagnosis not present

## 2024-09-16 DIAGNOSIS — E663 Overweight: Secondary | ICD-10-CM | POA: Diagnosis not present

## 2024-09-16 DIAGNOSIS — R739 Hyperglycemia, unspecified: Secondary | ICD-10-CM

## 2024-09-16 DIAGNOSIS — D582 Other hemoglobinopathies: Secondary | ICD-10-CM | POA: Diagnosis not present

## 2024-09-16 MED ORDER — AMPHETAMINE-DEXTROAMPHET ER 30 MG PO CP24
30.0000 mg | ORAL_CAPSULE | Freq: Every day | ORAL | 0 refills | Status: AC
  Filled 2024-09-16: qty 30, 30d supply, fill #0

## 2024-09-16 MED ORDER — AMPHETAMINE-DEXTROAMPHET ER 30 MG PO CP24
30.0000 mg | ORAL_CAPSULE | Freq: Every day | ORAL | 0 refills | Status: AC
  Filled 2024-09-16 – 2024-10-05 (×2): qty 30, 30d supply, fill #0

## 2024-09-16 NOTE — Progress Notes (Unsigned)
 Complete physical exam   Patient: Mark Hardy   DOB: 26-Dec-2005   18 y.o. Male  MRN: [id number] Visit Date: 09/16/2024  Today's healthcare provider: Jon Eva, MD   Chief Complaint  Patient presents with  . Annual Exam   Subjective    Mark Hardy is a 18 y.o. male who presents today for a complete physical exam.     Discussed the use of AI scribe software for clinical note transcription with the patient, who gave verbal consent to proceed.  History of Present Illness           Last depression screening scores    09/16/2024    3:37 PM 06/13/2024   10:56 AM 11/24/2023   11:12 AM  PHQ 2/9 Scores  PHQ - 2 Score 0 1 1  PHQ- 9 Score 3 4  4       Data saved with a previous flowsheet row definition   Last fall risk screening    11/24/2023   11:12 AM  Fall Risk   Falls in the past year? 1  Number falls in past yr: 0  Injury with Fall? 0   Risk for fall due to : No Fall Risks  Follow up Falls evaluation completed     Data saved with a previous flowsheet row definition    {VISON DENTAL STD PSA (Optional):27386}  {History (Optional):23778}  Medications: Outpatient Medications Prior to Visit  Medication Sig  . albuterol  (VENTOLIN  HFA) 108 (90 Base) MCG/ACT inhaler Inhale 2 puffs into the lungs every 4 (four) hours as needed for wheezing  . [DISCONTINUED] amphetamine -dextroamphetamine  (ADDERALL  XR) 30 MG 24 hr capsule Take 1 capsule (30 mg total) by mouth daily.  . [DISCONTINUED] amphetamine -dextroamphetamine  (ADDERALL  XR) 30 MG 24 hr capsule Take 1 capsule (30 mg total) by mouth daily.  . [DISCONTINUED] amphetamine -dextroamphetamine  (ADDERALL  XR) 30 MG 24 hr capsule Take 1 capsule (30 mg total) by mouth daily.   No facility-administered medications prior to visit.    Review of Systems  {Insert previous labs (optional):23779} {See past labs  Heme  Chem  Endocrine  Serology  Results Review (optional):1}  Objective    BP 114/73 (BP Location:  Left Arm, Patient Position: Sitting, Cuff Size: Normal)   Pulse 84   Ht 5' 10.5 (1.791 m)   Wt 186 lb 1.6 oz (84.4 kg)   SpO2 99%   BMI 26.33 kg/m  {Insert last BP/Wt (optional):23777}{See vitals history (optional):1}  Physical Exam Vitals reviewed.  Constitutional:      General: He is not in acute distress.    Appearance: Normal appearance. He is well-developed. He is not diaphoretic.  HENT:     Head: Normocephalic and atraumatic.     Right Ear: Tympanic membrane, ear canal and external ear normal.     Left Ear: Tympanic membrane, ear canal and external ear normal.     Nose: Nose normal.     Mouth/Throat:     Mouth: Mucous membranes are moist.     Pharynx: Oropharynx is clear. No oropharyngeal exudate.  Eyes:     General: No scleral icterus.    Conjunctiva/sclera: Conjunctivae normal.     Pupils: Pupils are equal, round, and reactive to light.  Neck:     Thyroid: No thyromegaly.  Cardiovascular:     Rate and Rhythm: Normal rate and regular rhythm.     Heart sounds: Normal heart sounds. No murmur heard. Pulmonary:     Effort: Pulmonary effort is normal. No  respiratory distress.     Breath sounds: Normal breath sounds. No wheezing or rales.  Abdominal:     General: There is no distension.     Palpations: Abdomen is soft.     Tenderness: There is no abdominal tenderness.  Musculoskeletal:        General: No deformity.     Cervical back: Neck supple.     Right lower leg: No edema.     Left lower leg: No edema.  Lymphadenopathy:     Cervical: No cervical adenopathy.  Skin:    General: Skin is warm and dry.     Findings: No rash.  Neurological:     Mental Status: He is alert and oriented to person, place, and time. Mental status is at baseline.     Gait: Gait normal.  Psychiatric:        Mood and Affect: Mood normal.        Behavior: Behavior normal.        Thought Content: Thought content normal.      No results found for any visits on 09/16/24.  Assessment &  Plan    Routine Health Maintenance and Physical Exam  Exercise Activities and Dietary recommendations  Goals   None     Immunization History  Administered Date(s) Administered  . DTaP 11/18/2004, 01/18/2005, 04/05/2005, 01/07/2006, 10/03/2008  . Dtap, Unspecified 11/18/2004, 01/18/2005, 04/05/2005, 01/07/2006, 10/03/2008  . HIB (PRP-OMP) 11/18/2004, 01/18/2005, 10/29/2005  . HIB, Unspecified 11/18/2004, 01/18/2005, 10/29/2005  . HPV Quadrivalent 07/21/2016, 10/30/2017  . Hep A, Unspecified 01/07/2006, 07/14/2006  . Hep B, Unspecified 03/14/2004, 11/18/2004, 07/07/2005  . Hepatitis A 01/07/2006, 07/14/2006  . Hepatitis B Dec [birth year], 2003, 11/18/2004, 07/07/2005  . Hpv-Unspecified 07/21/2016, 10/30/2017  . IPV 11/18/2004, 01/18/2005, 07/07/2005, 10/03/2008  . Influenza Inj Mdck Quad Pf 08/29/2022  . Influenza, Mdck, Trivalent,PF 6+ MOS(egg free) 07/20/2023  . Influenza,inj,Quad PF,6+ Mos 07/04/2019, 07/04/2019  . Influenza-Unspecified 07/04/2019  . MMR 10/29/2005, 10/03/2008  . MenQuadfi_Meningococcal Groups ACYW Conjugate 06/01/2022  . Meningococcal B, OMV 06/01/2022, 07/08/2022  . Meningococcal Conjugate 07/21/2016  . Meningococcal Mcv4,unspecified 07/21/2016  . PFIZER(Purple Top)SARS-COV-2 Vaccination 02/29/2020, 03/24/2020  . Pneumococcal Conjugate-13 11/18/2004, 01/18/2005, 04/05/2005, 10/29/2005  . Tdap 07/20/2015  . Varicella 10/29/2005, 10/03/2008    Health Maintenance  Topic Date Due  . Pneumococcal Vaccine (1 of 1 - PPSV23, PCV20, or PCV21) 09/16/2010  . HIV Screening  Never done  . Hepatitis C Screening  Never done  . COVID-19 Vaccine (3 - 2025-26 season) 06/17/2024  . Influenza Vaccine  01/14/2025 (Originally 05/17/2024)  . DTaP/Tdap/Td (7 - Td or Tdap) 07/19/2025  . HPV VACCINES  Completed  . Meningococcal B Vaccine  Completed  . Hepatitis B Vaccines 19-59 Average Risk  Discontinued    Discussed health benefits of physical activity, and encouraged him to engage  in regular exercise appropriate for his age and condition.  Problem List Items Addressed This Visit       Other   Attention deficit hyperactivity disorder (ADHD), combined type   Other Visit Diagnoses       Encounter for annual physical exam    -  Primary   Relevant Orders   Lipid panel   Comprehensive metabolic panel with GFR   CBC w/Diff/Platelet   Hemoglobin A1c   TSH     Elevated hemoglobin       Relevant Orders   CBC w/Diff/Platelet     Overweight       Relevant Orders   Lipid panel  Comprehensive metabolic panel with GFR   CBC w/Diff/Platelet   Hemoglobin A1c   TSH     Hyperglycemia       Relevant Orders   Hemoglobin A1c                   Return in about 4 months (around 01/15/2025) for ADD f/u, virtual ok.     Jon Eva, MD  [facility] [phone] (phone) [phone] (fax)  [facility]

## 2024-09-26 ENCOUNTER — Other Ambulatory Visit: Payer: Self-pay

## 2024-10-05 ENCOUNTER — Other Ambulatory Visit: Payer: Self-pay

## 2024-10-07 ENCOUNTER — Other Ambulatory Visit: Payer: Self-pay

## 2024-12-02 ENCOUNTER — Ambulatory Visit: Admitting: Family Medicine

## 2025-01-16 ENCOUNTER — Ambulatory Visit: Admitting: Family Medicine
